# Patient Record
Sex: Female | Born: 1965 | Race: White | Hispanic: Yes | Marital: Married | Smoking: Never smoker
Health system: Southern US, Community
[De-identification: ages and names within clinical notes are randomized; demographics above are authoritative.]

## PROBLEM LIST (undated history)

## (undated) HISTORY — PX: CYST REMOVAL NECK: SHX6281

---

## 1999-04-18 ENCOUNTER — Emergency Department (HOSPITAL_COMMUNITY): Admission: EM | Admit: 1999-04-18 | Discharge: 1999-04-18 | Payer: Self-pay | Admitting: Emergency Medicine

## 1999-06-14 ENCOUNTER — Emergency Department (HOSPITAL_COMMUNITY): Admission: EM | Admit: 1999-06-14 | Discharge: 1999-06-14 | Payer: Self-pay | Admitting: Internal Medicine

## 1999-06-29 ENCOUNTER — Emergency Department (HOSPITAL_COMMUNITY): Admission: EM | Admit: 1999-06-29 | Discharge: 1999-06-29 | Payer: Self-pay | Admitting: Emergency Medicine

## 2004-01-14 ENCOUNTER — Inpatient Hospital Stay (HOSPITAL_COMMUNITY): Admission: EM | Admit: 2004-01-14 | Discharge: 2004-01-16 | Payer: Self-pay | Admitting: Emergency Medicine

## 2004-01-14 ENCOUNTER — Encounter: Payer: Self-pay | Admitting: Internal Medicine

## 2004-01-18 ENCOUNTER — Encounter: Payer: Self-pay | Admitting: Emergency Medicine

## 2004-01-18 ENCOUNTER — Emergency Department (HOSPITAL_COMMUNITY): Admission: EM | Admit: 2004-01-18 | Discharge: 2004-01-18 | Payer: Self-pay | Admitting: Emergency Medicine

## 2004-01-18 ENCOUNTER — Encounter (INDEPENDENT_AMBULATORY_CARE_PROVIDER_SITE_OTHER): Payer: Self-pay | Admitting: Cardiology

## 2004-01-25 ENCOUNTER — Encounter: Admission: RE | Admit: 2004-01-25 | Discharge: 2004-01-25 | Payer: Self-pay | Admitting: Internal Medicine

## 2004-02-24 ENCOUNTER — Encounter: Admission: RE | Admit: 2004-02-24 | Discharge: 2004-02-24 | Payer: Self-pay | Admitting: Internal Medicine

## 2005-09-28 ENCOUNTER — Emergency Department (HOSPITAL_COMMUNITY): Admission: EM | Admit: 2005-09-28 | Discharge: 2005-09-28 | Payer: Self-pay | Admitting: Emergency Medicine

## 2006-01-31 ENCOUNTER — Ambulatory Visit: Payer: Self-pay | Admitting: Gynecology

## 2006-02-08 ENCOUNTER — Encounter: Payer: Self-pay | Admitting: *Deleted

## 2006-02-08 ENCOUNTER — Ambulatory Visit (HOSPITAL_COMMUNITY): Admission: RE | Admit: 2006-02-08 | Discharge: 2006-02-08 | Payer: Self-pay | Admitting: *Deleted

## 2006-07-15 ENCOUNTER — Inpatient Hospital Stay (HOSPITAL_COMMUNITY): Admission: AD | Admit: 2006-07-15 | Discharge: 2006-07-15 | Payer: Self-pay | Admitting: Gynecology

## 2006-07-18 ENCOUNTER — Other Ambulatory Visit: Admission: RE | Admit: 2006-07-18 | Discharge: 2006-07-18 | Payer: Self-pay | Admitting: Obstetrics and Gynecology

## 2006-07-18 ENCOUNTER — Ambulatory Visit: Payer: Self-pay | Admitting: Obstetrics & Gynecology

## 2006-07-18 ENCOUNTER — Encounter (INDEPENDENT_AMBULATORY_CARE_PROVIDER_SITE_OTHER): Payer: Self-pay | Admitting: *Deleted

## 2006-07-18 ENCOUNTER — Encounter (INDEPENDENT_AMBULATORY_CARE_PROVIDER_SITE_OTHER): Payer: Self-pay | Admitting: Specialist

## 2006-08-01 ENCOUNTER — Ambulatory Visit: Payer: Self-pay | Admitting: Obstetrics and Gynecology

## 2006-10-02 ENCOUNTER — Ambulatory Visit: Payer: Self-pay | Admitting: Obstetrics and Gynecology

## 2006-12-23 ENCOUNTER — Ambulatory Visit: Payer: Self-pay | Admitting: Obstetrics and Gynecology

## 2006-12-23 ENCOUNTER — Ambulatory Visit (HOSPITAL_COMMUNITY): Admission: RE | Admit: 2006-12-23 | Discharge: 2006-12-23 | Payer: Self-pay | Admitting: Obstetrics and Gynecology

## 2006-12-23 ENCOUNTER — Encounter (INDEPENDENT_AMBULATORY_CARE_PROVIDER_SITE_OTHER): Payer: Self-pay | Admitting: *Deleted

## 2007-01-08 ENCOUNTER — Encounter (INDEPENDENT_AMBULATORY_CARE_PROVIDER_SITE_OTHER): Payer: Self-pay | Admitting: *Deleted

## 2007-01-08 ENCOUNTER — Other Ambulatory Visit: Admission: RE | Admit: 2007-01-08 | Discharge: 2007-01-08 | Payer: Self-pay | Admitting: Obstetrics and Gynecology

## 2007-01-08 ENCOUNTER — Ambulatory Visit: Payer: Self-pay | Admitting: *Deleted

## 2007-01-23 ENCOUNTER — Ambulatory Visit: Payer: Self-pay | Admitting: *Deleted

## 2007-07-11 ENCOUNTER — Inpatient Hospital Stay (HOSPITAL_COMMUNITY): Admission: AD | Admit: 2007-07-11 | Discharge: 2007-07-11 | Payer: Self-pay | Admitting: Gynecology

## 2007-10-30 ENCOUNTER — Inpatient Hospital Stay (HOSPITAL_COMMUNITY): Admission: AD | Admit: 2007-10-30 | Discharge: 2007-10-31 | Payer: Self-pay | Admitting: Obstetrics

## 2007-11-07 ENCOUNTER — Inpatient Hospital Stay (HOSPITAL_COMMUNITY): Admission: AD | Admit: 2007-11-07 | Discharge: 2007-11-07 | Payer: Self-pay | Admitting: Gynecology

## 2007-11-20 ENCOUNTER — Inpatient Hospital Stay (HOSPITAL_COMMUNITY): Admission: AD | Admit: 2007-11-20 | Discharge: 2007-11-20 | Payer: Self-pay | Admitting: Obstetrics

## 2008-02-23 ENCOUNTER — Inpatient Hospital Stay (HOSPITAL_COMMUNITY): Admission: AD | Admit: 2008-02-23 | Discharge: 2008-02-25 | Payer: Self-pay | Admitting: Obstetrics

## 2008-02-23 ENCOUNTER — Encounter (INDEPENDENT_AMBULATORY_CARE_PROVIDER_SITE_OTHER): Payer: Self-pay | Admitting: Obstetrics

## 2008-04-02 ENCOUNTER — Ambulatory Visit (HOSPITAL_COMMUNITY): Admission: RE | Admit: 2008-04-02 | Discharge: 2008-04-02 | Payer: Self-pay | Admitting: Obstetrics

## 2008-09-20 IMAGING — US US PELVIS COMPLETE MODIFY
1 series · 13 of 25 positions shown · non-contrast
Comparison: 11/20/2007

CLINICAL DATA: Known left-sided myoma.  6 weeks postpartum with
left adnexal pain.

TRANSABDOMINAL AND TRANSVAGINAL ULTRASOUND OF PELVIS
TECHNIQUE: Both transabdominal and transvaginal ultrasound
examinations of the pelvis were performed including evaluation of
the uterus, ovaries, adnexal regions, and pelvic cul-de-sac.

[Series 1: us pelvis complete modify · 0.28mm/px · 13 of 51 slices shown]
[im 1/51]
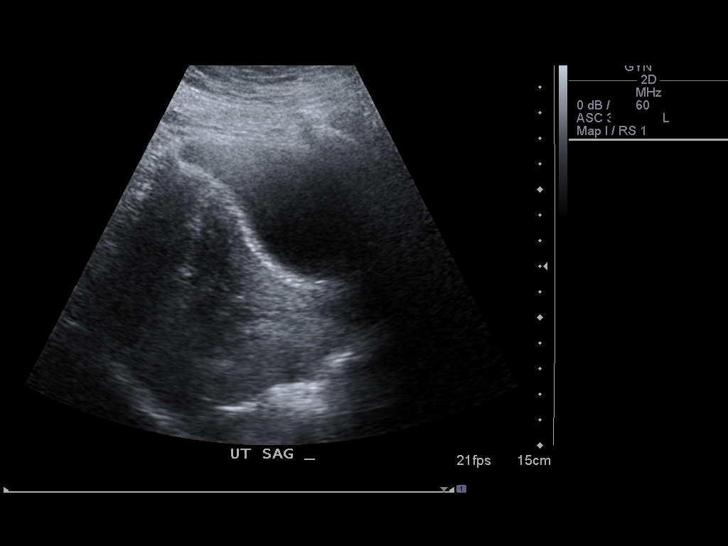
[im 5/51]
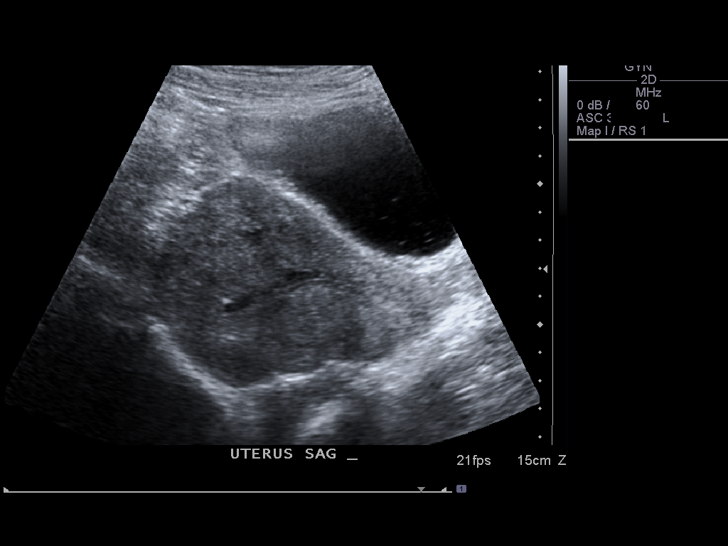
[im 9/51]
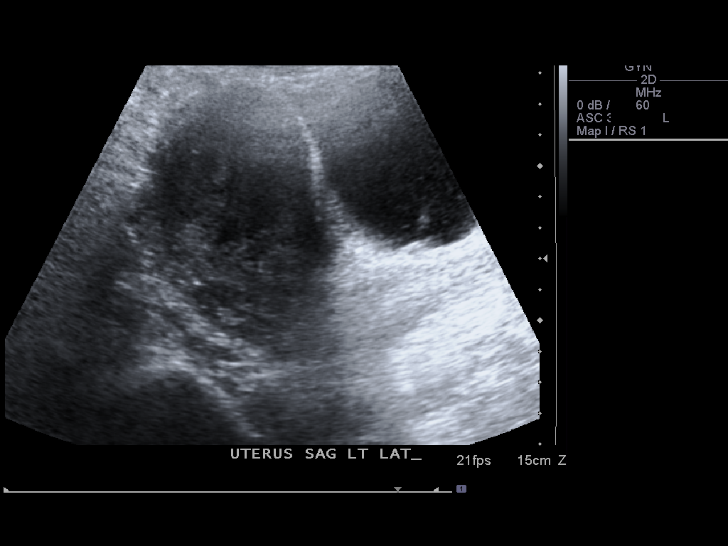
[im 13/51]
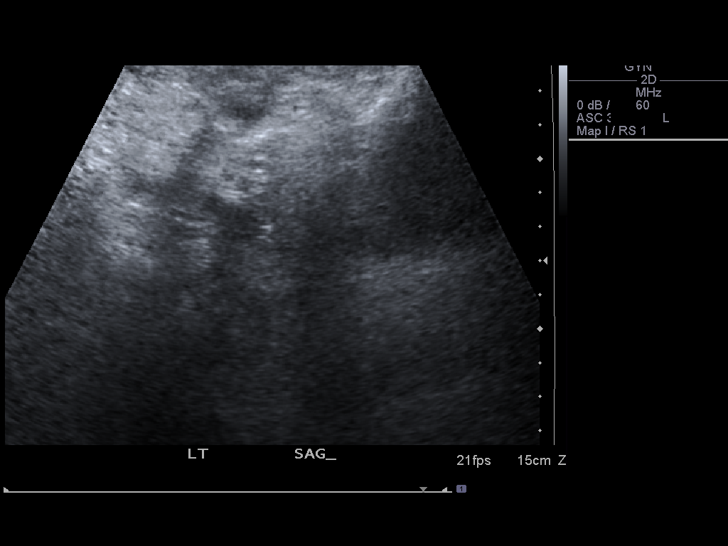
[im 17/51]
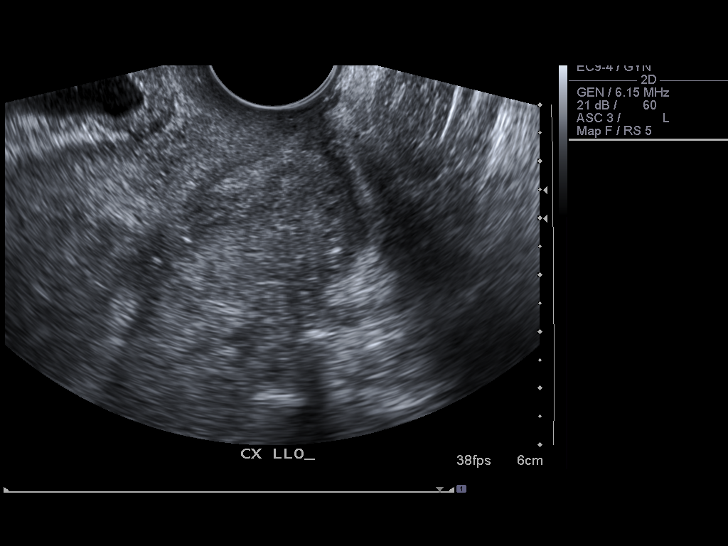
[im 21/51]
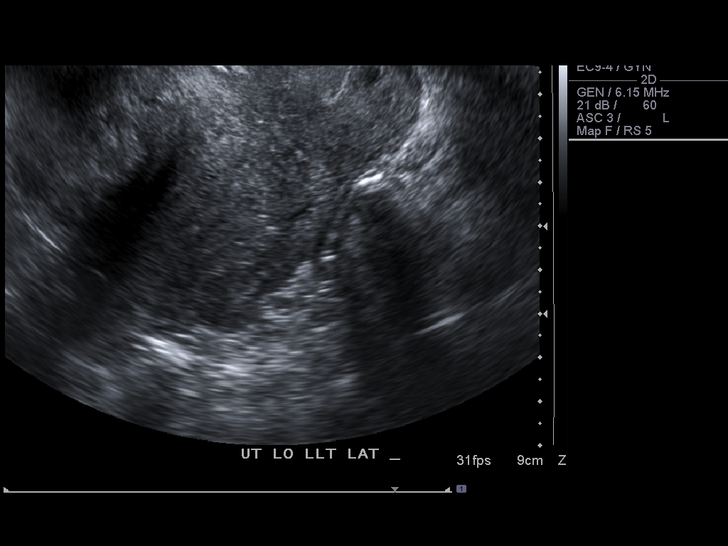
[im 26/51]
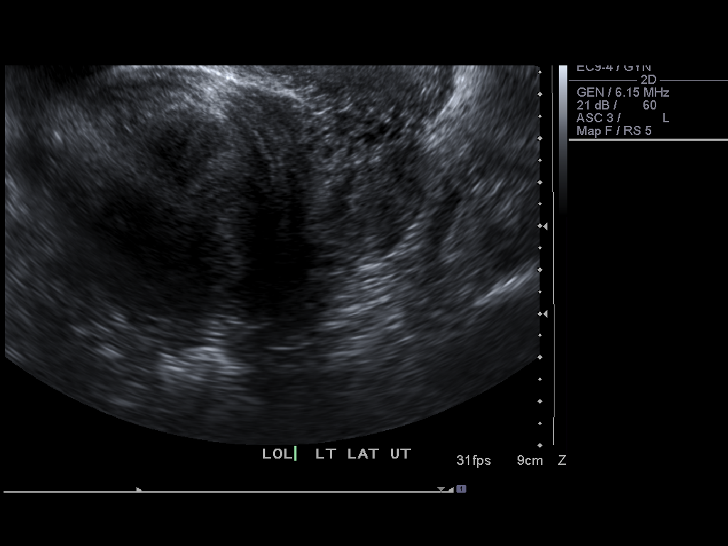
[im 30/51]
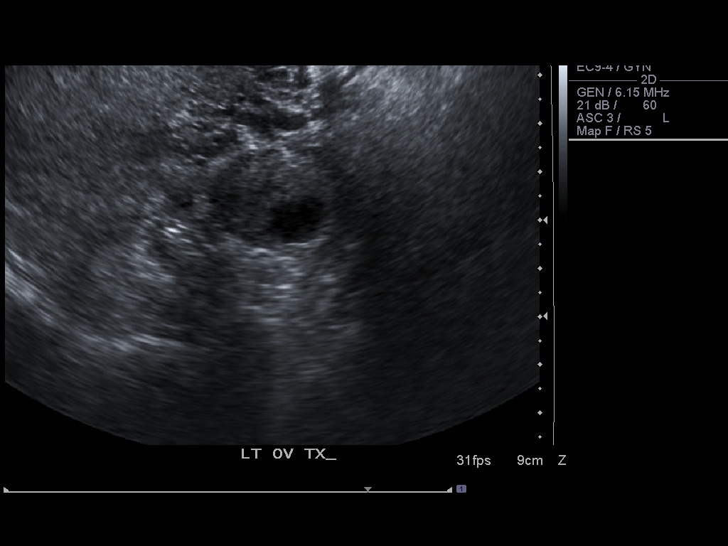
[im 34/51]
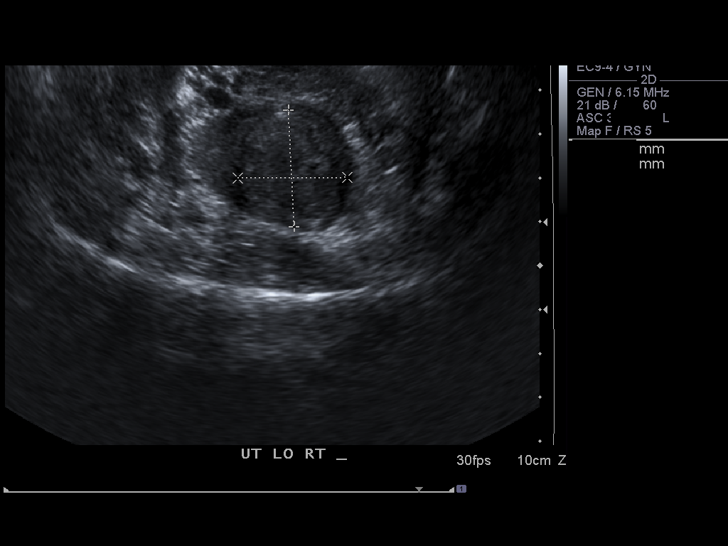
[im 38/51]
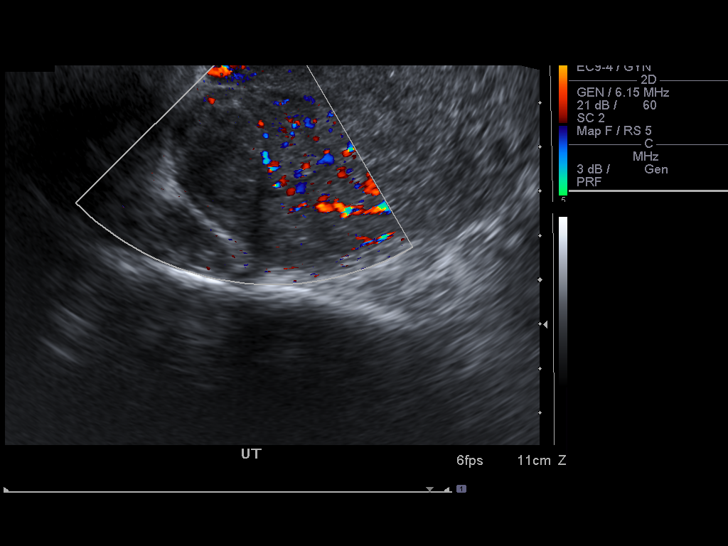
[im 42/51]
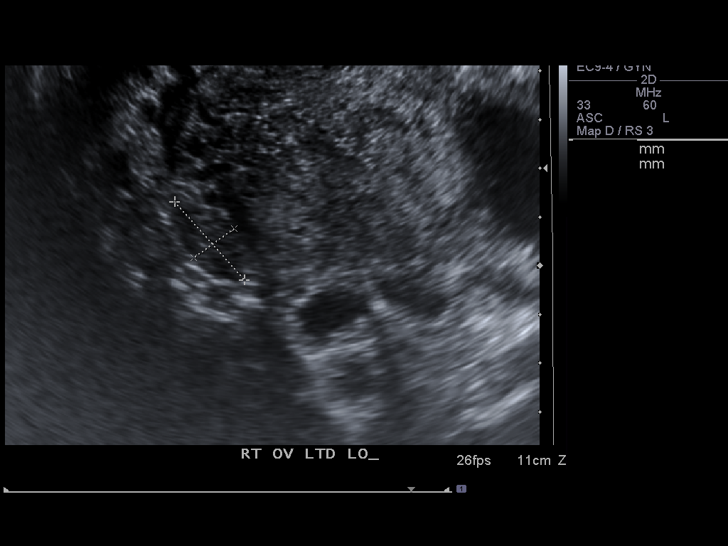
[im 46/51]
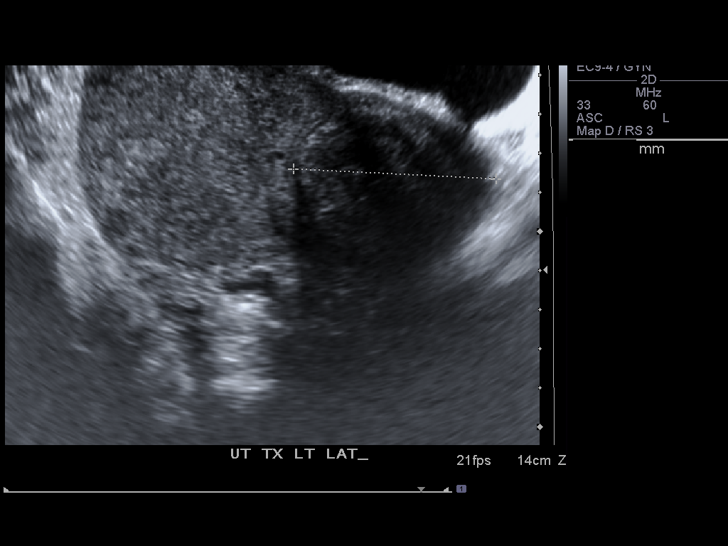
[im 51/51]
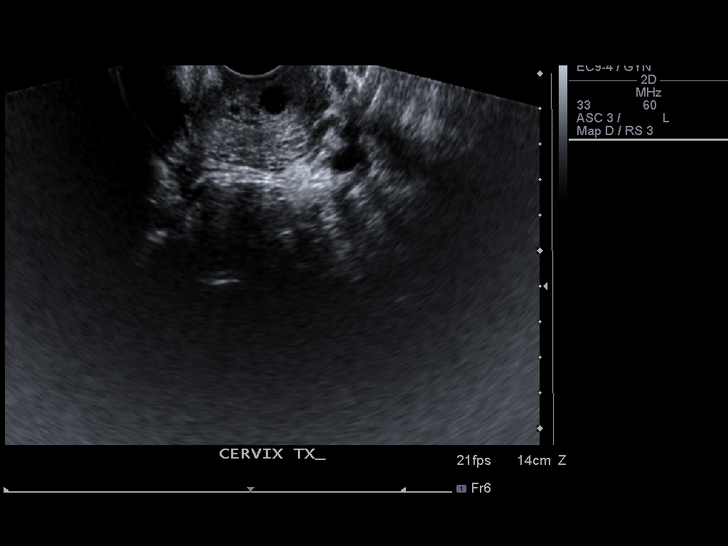

[13 of 25 positions shown; findings below may reference images not displayed]

FINDINGS: The uterus has a sagittal length of 9.8 cm, an AP width
of 7.1 cm and a transverse width of 7.7 cm.  Two focal fibroids are
identified with the largest located in the left lateral mid body
measuring 5.3 x 5.1 centimeters and in the right lateral upper
uterine segment measuring 1.8 x 2.7 x 2.5 cm.  Both of these have a
subserosal component

The endometrial lining is homogeneously echogenic with an AP width
of 1.1 cm.  This would correlate with the patient's recent
postpartum status.  No areas of focal thickening or inhomogeneity
are seen.

Both ovaries are visualized with the right ovary measuring 2.2 x
1.0  by 1.3 cm and the left ovary measuring 2.7 by 2.8 x 1.2 of 7
cm.. No pelvic fluid is seen
IMPRESSION: Uterine fibroids with sizes and locations as noted above.  Normal
postpartum endometrial lining.  Normal ovaries.

## 2010-02-05 ENCOUNTER — Emergency Department (HOSPITAL_COMMUNITY): Admission: EM | Admit: 2010-02-05 | Discharge: 2010-02-05 | Payer: Self-pay | Admitting: Emergency Medicine

## 2011-02-23 NOTE — Group Therapy Note (Signed)
[name], [name] NO.:  [id number]   MEDICAL RECORD NO.:  [id number]          PATIENT TYPE:  WOC   LOCATION:  [facility]                   FACILITY:  [facility]   PHYSICIAN:  Dorthula Perfect, MD     DATE OF BIRTH:  04-16-1966   DATE OF SERVICE:                                    CLINIC NOTE   This 45 year old Hispanic female gravida 0 is referred from [facility] for abnormal  vaginal bleeding.  She was seen there on October 8th.  The patient is not a  good historian.  The translator does all she can to help.  She started  bleeding September 18th and has pretty much been bleeding since that time.  When she was seen in the [facility], her hematocrit was 36.7, and her hemoglobin  was 12.4.  White count was normal.  The examiner thought that the uterus was  somewhat enlarged.  She has been in Lo-Ovral for a number of months in an  attempt to control the bleeding.  This had not been very successful.  In  reality, she has been having abnormal bleeding for probably the last year or  two.   In 2005, she had an ultrasound that revealed the uterus to be 8.4 x 5.2 x  6.7 cm.  There was a pedunculated myoma measuring about 3.5 cm in the left  side of the uterus.   When I started seeing the patient, I only had a skinny chart, and it turned  out she had been seen here in the past.  We then obtained a chart.  She was  seen here by Dr. Mia Marsh on January 31, 2006 because of heavy menses and  anemia.  At that time, he did not think an endometrial biopsy needed to be  done, but we did state that she should stop the birth control pills before  it is done; however, because of this continued history, I will do an  endometrial biopsy at this time, despite the fact that she is on hormonal  therapy.  An ultrasound was done Feb 08, 2006, which revealed a large  subserosal fibroid measuring 5 x 4.4 x 3.9 cm, and this was described as  having increased in size on the previous study when it was 3.7 cm in  greatest diameter.  The endometrium in May was 12-13 mm in maximum  thickness.  There was also suggestion of a small mucosal fibroid to be  present.   She is not sexually active at this time.  She is desirous of having a child  but understands at the age of 89 with uterine fibroids, this is probably not  going to happen.  Her last Pap smear was some time ago.  She believes her  Pap smears in the past have all been normal.   PHYSICAL EXAMINATION:  VITAL SIGNS:  Height 5 feet 1.  Weight 165.4.  Blood  pressure 128/78.  ABDOMEN:  Her abdomen is somewhat obese.  No masses are palpable.  There is  no abdominal tenderness.  PELVIC:  External genitalia and BS glands were normal.  Vaginal was  epithelialized as was the cervix.  A Pap smear  was obtained.  The uterus is  upper limits of normal size and somewhat nodular.  Adnexal structures,  including ovaries, were normal.   IMPRESSION:  Abnormal uterine bleeding.   DISPOSITION:  1. Pap smear.  2. An endometrial biopsy was done without difficulty.  The uterus sounded      approximately 11 cm.  3. She will return in a couple of weeks for results.  4. Possibilities in the future include changing her hormone preparation to      see if that helps her bleeding, perhaps a D&C and hysteroscopy to see      if this submucosal fibroid might be the problem with the bleeding, and      of course, the possibility of the definitive treatment with      hysterectomy.  5. Addendum:  I did not list above, but I learned late in the encounter      that her father has died this year from colon cancer.  At her next      visit, she needs to be counseled about starting colonoscopies.           ______________________________  Dorthula Perfect, MD     ER/MEDQ  D:  07/18/2006  T:  07/20/2006  Job:  [id number]

## 2011-02-23 NOTE — Group Therapy Note (Signed)
[name], [name] NO.:  [id number]   MEDICAL RECORD NO.:  [id number]          PATIENT TYPE:  WOC   LOCATION:  [facility]                   FACILITY:  [facility]   PHYSICIAN:  Argentina Donovan, MD        DATE OF BIRTH:  01-10-1966   DATE OF SERVICE:  08/01/2006                                    CLINIC NOTE   HISTORY:  The patient is a 45 year old Spanish speaking Hispanic female,  nulligravida who complains of persistent vaginal bleeding.  See previous  note.  Very difficult patient to deal with, even with the interpreter.  We  have given her choice of continuing to bleed since her perception is much  greater than  her actual amount of bleeding since she has a hemoglobin of  12.2 and a hematocrit of 32, endometrial ablation or hormone therapy.  She  stopped the hormone therapy because she does not want to take the pill and  she said it did not help.  This time she is not bleeding.  These I told her  were her only options with [place].  She is going to make up her mind what she  wants to do.   IMPRESSION:  Dysfunctional uterine bleeding.           ______________________________  Argentina Donovan, MD     PR/MEDQ  D:  08/01/2006  T:  08/02/2006  Job:  [id number]

## 2011-02-23 NOTE — Op Note (Signed)
[name], [name]      ACCOUNT NO.:  [id number]   MEDICAL RECORD NO.:  [id number]          PATIENT TYPE:  AMB   LOCATION:  [facility]                           [facility]   PHYSICIAN:  Phil D. Okey Dupre, M.D.     DATE OF BIRTH:  1966-01-30   DATE OF PROCEDURE:  12/23/2006  DATE OF DISCHARGE:                               OPERATIVE REPORT   PROCEDURE:  Hysteroscopic hydrothermal ablation hysteroscopy and  hydrothermal ablation of the endometrium plus postablation curettage.   SURGEON:  Dr. Okey Dupre   ANESTHESIA:  General plus local.   BLOOD LOSS:  Minimal.   POSTOPERATIVE CONDITION:  Satisfactory.   SPECIMENS TO PATHOLOGY:  Postablation curettings.   OPERATIVE FINDINGS:  On entering the uterus with the hysteroscope, we  saw multiple endometrial polyps with an endometrium that did not look  terribly plush.  It was decided that after the ablation, we would  curette the endometrium to make sure that the polyps were well removed.  The procedure went as follows:  Under satisfactory general anesthesia,  the patient in dorsal lithotomy position, perineum, vagina prepped and  draped in the usual sterile manner.  Bimanual pelvic examination under  anesthesia revealed the uterus at first-degree retroversion, freely  movable, normal size, shape, and consistency.  The adnexa were not well  palpated.  Cul-de-sac was free.  A weighted speculum was placed in the  posterior fourchette of the vagina through a marital introitus.  BUS  within normal limits.  Vagina is clean and well-rugated.  Anterior lip  of the cervix was grasped with a single-tooth tenaculum, and 10 mL of 1%  Xylocaine plain was injected into each of the paracervical areas at 4  and 8 o'clock for further patient comfort.  After this uterine cavity  was sounded to 7 cm, the cervical os dilated to #8 Hagar dilator, the  hysteroscope inserted into the uterine cavity.  The entire uterine  cavity was easily visualized with the  exception of the ostium on the  patient's right side which was blocked by a polyp, and I could not  manipulate the scope to see that.  We brought the scope out to just at  the internal os, reinserted it a small amount, and then started the  thermal ablation procedure, the usual heating up to 81 degrees.  Following that, the ablation started.  The temperature reached about 92  degrees cGy, and the ablation was undertaken for a 10-minute period at  which time the cooling off period occurred.  Blanching occurred well to  the entire uterine cavity that could be seen, and the scope was then  finally removed.  The uterine cavity was vigorously scraped with a  serrated curette and this tissue sent for pathological diagnosis.  However, I do not think that we will see very much except the ablated  remains.  The tenaculum was removed from the cervix.  The area was  observed for bleeding, and no significant bleeding was noted, and the  patient was transferred to recovery in satisfactory condition having  tolerated the procedure well.          ______________________________  Phil D. Okey Dupre, M.D.    PDR/MEDQ  D:  12/23/2006  T:  12/23/2006  Job:  [id number]

## 2011-02-23 NOTE — Group Therapy Note (Signed)
[name], [name] NO.:  [id number]   MEDICAL RECORD NO.:  [id number]          PATIENT TYPE:  WOC   LOCATION:  [facility]                   FACILITY:  [facility]   PHYSICIAN:  Carolanne Grumbling, M.D.   DATE OF BIRTH:  1966-02-26   DATE OF SERVICE:  01/08/2007                                  CLINIC NOTE   HISTORY OF PRESENT ILLNESS:  A 45 year old G0 here for postop visit.  Patient had hysteroscopic hydrothermal ablation at the endometrium plus  post ablative curettage for a history of dysfunctional uterine bleeding.  Pathology showed proliferative endometrium fragments suggestive of  endometrial polyps.  Since surgery, patient endorses a watery discharge.  Denies fever, chills, night sweats.   Upon review of the chart, it was noted that patient had an atypical Pap  smear on July 18, 2006 that showed glandular cell abnormality.  HPV  typing negative for high risk type.  No colposcopy done per the chart.   PHYSICAL EXAMINATION:  Blood pressure 123/70, weight 163.5 pounds.  GENERAL:  Well-developed, well-nourished female in no apparent distress.  GU:  Normal external genitalia, vagina pink rugae, cervix nulliparous.  There is an ulceration noted at approximately 4 o'clock.  Colposcopy  documented on the dysplasia sheet.  Dr. Penne Lash also evaluated cervix.   ASSESSMENT:  1. Endometrial biopsy with dysfunctional uterine bleeding, status post      hydro ablation and curettage.  2. Atypical glandular cells in October 2007, status post colposcopy      with endometrial biopsies and ECC today.   PLAN:  Repeat Pap and colpo done today.  Will have patient return in 2  weeks for followup.           ______________________________  Carolanne Grumbling, M.D.     TW/MEDQ  D:  01/08/2007  T:  01/08/2007  Job:  [id number]

## 2011-02-23 NOTE — Group Therapy Note (Signed)
[name], [name] NO.:  [id number]   MEDICAL RECORD NO.:  [id number]          PATIENT TYPE:  WOC   LOCATION:  [facility]                   [facility]ACILITY[facility]   PHYSICIAN:  Argentina Donovan, MD        DATE OF BIRTH:  03-03-1966   DATE OF SERVICE:                                  CLINIC NOTE   The patient is a 45 year old Hispanic, Spanish-speaking nulligravida  with persistent vaginal bleeding.  Apparently graded not as much as her  appreciation of it.  She has a normal hemoglobin.  However, we did an  endometrial biopsy previously, which showed obvious dysfunctional  bleeding with extensive breakdown and degenerative changes.  No  hyperplasia identified.  She did not want to continue oral  contraceptives or any other hormone therapy.  Was not sure what she  wanted to do.  We have talked to her about the options.  We have told  her about endometrial ablation.  She finally, after reading the  __________ reading, we were having a person translate for her and read  it, decided that she was going to go through the ablation procedure.  We  are going to scheduled for __________ hydro-ablation.  The written  Hispanic copy of the explanation has been given to the patient and is  described in detail the problem with the patient, and we will go ahead  and get it scheduled.           ______________________________  Argentina Donovan, MD     PR/MEDQ  D:  10/02/2006  T:  10/02/2006  Job:  [id number]

## 2011-02-23 NOTE — Group Therapy Note (Signed)
NAMESHRESHTA, MEDLEY NO.:  [id number]   MEDICAL RECORD NO.:  [id number]          PATIENT TYPE:  WOC   LOCATION:  [facility]                   [facility]ACILITY:  [facility]   PHYSICIAN:  Ginger Carne, MD DATE OF BIRTH:  06/25/1966   DATE OF SERVICE:  01/31/2006                                    CLINIC NOTE   REASON FOR CONSULTATION:  Heavy menses and anemia.   HISTORY OF PRESENT ILLNESS:  This is a 45 year old nulligravida Hispanic  female who is referred because of a history of heavy menses and a history of  anemia.  The patient was [facility] and demonstrated  hemoglobin of 8 and hematocrit of 24.7 on September 24, 2005.  At that time  she had reported heavy menses slightly irregular, but basically once a  month.  There is no history of suggestive of bleeding diatheses in her  family or congenitally.  This patient takes no medications to enhance her  bleeding propensity and takes no herbal remedy to contribute to the  bleeding.  She had been placed on multivitamins with iron; however, she has  stopped taking the iron recently.  She has been on oral contraceptives since  October 02, 2005.   The patient was in [facility] for microcytic anemia in April of  2005.  At that time an ultrasound was essentially unremarkable with a small  pedunculated fibroid.   IMPRESSION AND PLAN:  Abnormal uterine bleeding with anemia.  I explained to  the patient the best course of action at this point is to repeat a sonogram  to assure that no changes have occurred during the two year interval.  We  also repeated a CBC to assess her current anemic status.  Based on these two  findings patient will be appropriately managed in terms of iron replacement  therapy and possible hemoglobin electrophoresis if warranted.  I am not sure  at this time that an endometrial biopsy is necessary but I asked the patient  to stop taking birth control pills for about a  month and then if necessary a  biopsy will be performed without the effects of oral contraceptive agents.           ______________________________  Ginger Carne, MD     SHB/MEDQ  D:  01/31/2006  T:  02/01/2006  Job:  [id number]

## 2011-04-20 ENCOUNTER — Emergency Department (HOSPITAL_COMMUNITY)
Admission: EM | Admit: 2011-04-20 | Discharge: 2011-04-20 | Disposition: A | Discharge status: Home / Self Care | Payer: Self-pay | Attending: Emergency Medicine | Admitting: Emergency Medicine

## 2011-04-20 ENCOUNTER — Emergency Department (HOSPITAL_COMMUNITY): Payer: Self-pay

## 2011-04-20 DIAGNOSIS — L298 Other pruritus: Secondary | ICD-10-CM | POA: Insufficient documentation

## 2011-04-20 DIAGNOSIS — R1013 Epigastric pain: Secondary | ICD-10-CM | POA: Insufficient documentation

## 2011-04-20 DIAGNOSIS — M542 Cervicalgia: Secondary | ICD-10-CM | POA: Insufficient documentation

## 2011-04-20 DIAGNOSIS — M79609 Pain in unspecified limb: Secondary | ICD-10-CM | POA: Insufficient documentation

## 2011-04-20 DIAGNOSIS — L2989 Other pruritus: Secondary | ICD-10-CM | POA: Insufficient documentation

## 2011-04-20 DIAGNOSIS — R51 Headache: Secondary | ICD-10-CM | POA: Insufficient documentation

## 2011-04-20 DIAGNOSIS — M546 Pain in thoracic spine: Secondary | ICD-10-CM | POA: Insufficient documentation

## 2011-04-20 DIAGNOSIS — M545 Low back pain, unspecified: Secondary | ICD-10-CM | POA: Insufficient documentation

## 2011-04-20 DIAGNOSIS — R110 Nausea: Secondary | ICD-10-CM | POA: Insufficient documentation

## 2011-04-20 DIAGNOSIS — IMO0001 Reserved for inherently not codable concepts without codable children: Secondary | ICD-10-CM | POA: Insufficient documentation

## 2011-04-20 LAB — CBC
MCH: 31.8 pg (ref 26.0–34.0)
MCHC: 34.3 g/dL (ref 30.0–36.0)
MCV: 92.8 fL (ref 78.0–100.0)
Platelets: 217 10*3/uL (ref 150–400)
RBC: 3.87 MIL/uL (ref 3.87–5.11)
RDW: 13.1 % (ref 11.5–15.5)

## 2011-04-20 LAB — COMPREHENSIVE METABOLIC PANEL
ALT: 19 U/L (ref 0–35)
AST: 18 U/L (ref 0–37)
Albumin: 3.4 g/dL — ABNORMAL LOW (ref 3.5–5.2)
CO2: 26 mEq/L (ref 19–32)
Calcium: 8.8 mg/dL (ref 8.4–10.5)
Creatinine, Ser: <0.47 mg/dL — ABNORMAL LOW (ref 0.50–1.10)
Sodium: 137 mEq/L (ref 135–145)
Total Protein: 7.2 g/dL (ref 6.0–8.3)

## 2011-04-20 LAB — DIFFERENTIAL
Basophils Relative: 0 % (ref 0–1)
Eosinophils Absolute: 0.1 10*3/uL (ref 0.0–0.7)
Eosinophils Relative: 1 % (ref 0–5)
Lymphs Abs: 1.3 10*3/uL (ref 0.7–4.0)
Monocytes Relative: 13 % — ABNORMAL HIGH (ref 3–12)
Neutrophils Relative %: 65 % (ref 43–77)

## 2011-04-20 LAB — URINE MICROSCOPIC-ADD ON

## 2011-04-20 LAB — POCT PREGNANCY, URINE: Preg Test, Ur: NEGATIVE

## 2011-04-20 LAB — URINALYSIS, ROUTINE W REFLEX MICROSCOPIC
Glucose, UA: NEGATIVE mg/dL
Ketones, ur: NEGATIVE mg/dL
Leukocytes, UA: NEGATIVE
Nitrite: NEGATIVE
Specific Gravity, Urine: 1.021 (ref 1.005–1.030)
pH: 6.5 (ref 5.0–8.0)

## 2011-06-28 LAB — URINALYSIS, ROUTINE W REFLEX MICROSCOPIC
Bilirubin Urine: NEGATIVE
Bilirubin Urine: NEGATIVE
Glucose, UA: NEGATIVE
Hgb urine dipstick: NEGATIVE
Ketones, ur: NEGATIVE
Ketones, ur: NEGATIVE
Nitrite: NEGATIVE
Nitrite: NEGATIVE
Protein, ur: NEGATIVE
Specific Gravity, Urine: 1.01
Specific Gravity, Urine: 1.01
Urobilinogen, UA: 0.2
pH: 5.5
pH: 7
pH: 7.5

## 2011-06-28 LAB — URINE MICROSCOPIC-ADD ON

## 2011-06-29 LAB — DIFFERENTIAL
Eosinophils Absolute: 0
Lymphocytes Relative: 19
Lymphs Abs: 1.7
Neutro Abs: 6.5
Neutrophils Relative %: 72

## 2011-06-29 LAB — URINALYSIS, ROUTINE W REFLEX MICROSCOPIC
Nitrite: NEGATIVE
Protein, ur: NEGATIVE
Specific Gravity, Urine: 1.02
Urobilinogen, UA: 0.2

## 2011-06-29 LAB — CBC
MCV: 83.4
Platelets: 231
WBC: 8.9

## 2011-07-04 LAB — CBC
HCT: 30.5 — ABNORMAL LOW
Hemoglobin: 11.2 — ABNORMAL LOW
Hemoglobin: 9.9 — ABNORMAL LOW
MCHC: 32.6
MCV: 80.5
MCV: 81.2
Platelets: 178
RBC: 4.28
RDW: 21.9 — ABNORMAL HIGH
WBC: 11.4 — ABNORMAL HIGH

## 2011-07-04 LAB — COMPREHENSIVE METABOLIC PANEL
ALT: 10
AST: 23
Albumin: 2.7 — ABNORMAL LOW
Alkaline Phosphatase: 218 — ABNORMAL HIGH
BUN: 8
Chloride: 105
Potassium: 3.6
Sodium: 136
Total Bilirubin: 0.5

## 2011-07-04 LAB — URIC ACID: Uric Acid, Serum: 3.1

## 2011-07-04 LAB — RAPID HIV SCREEN (WH-MAU): Rapid HIV Screen: NONREACTIVE

## 2011-07-04 LAB — RPR: RPR Ser Ql: NONREACTIVE

## 2011-07-19 LAB — URINALYSIS, ROUTINE W REFLEX MICROSCOPIC
Bilirubin Urine: NEGATIVE
Ketones, ur: NEGATIVE
Nitrite: POSITIVE — AB
Protein, ur: NEGATIVE
Urobilinogen, UA: 0.2

## 2011-07-19 LAB — URINE CULTURE
Colony Count: NO GROWTH
Culture: NO GROWTH

## 2011-07-19 LAB — CBC
HCT: 31.1 — ABNORMAL LOW
Hemoglobin: 10.3 — ABNORMAL LOW
MCV: 75 — ABNORMAL LOW
Platelets: 308
RBC: 4.15

## 2011-07-19 LAB — POCT PREGNANCY, URINE
Operator id: 113551
Preg Test, Ur: POSITIVE

## 2011-07-19 LAB — WET PREP, GENITAL: Clue Cells Wet Prep HPF POC: NONE SEEN

## 2011-07-19 LAB — HCG, QUANTITATIVE, PREGNANCY: hCG, Beta Chain, Quant, S: 96187 — ABNORMAL HIGH

## 2013-02-19 ENCOUNTER — Encounter (HOSPITAL_COMMUNITY): Payer: Self-pay | Admitting: Emergency Medicine

## 2013-02-19 ENCOUNTER — Emergency Department (HOSPITAL_COMMUNITY)
Admission: EM | Admit: 2013-02-19 | Discharge: 2013-02-20 | Disposition: A | Discharge status: Home / Self Care | Payer: No Typology Code available for payment source | Attending: Emergency Medicine | Admitting: Emergency Medicine

## 2013-02-19 DIAGNOSIS — T1490XA Injury, unspecified, initial encounter: Secondary | ICD-10-CM | POA: Insufficient documentation

## 2013-02-19 DIAGNOSIS — IMO0002 Reserved for concepts with insufficient information to code with codable children: Secondary | ICD-10-CM | POA: Insufficient documentation

## 2013-02-19 DIAGNOSIS — Y92410 Unspecified street and highway as the place of occurrence of the external cause: Secondary | ICD-10-CM | POA: Insufficient documentation

## 2013-02-19 DIAGNOSIS — Y9389 Activity, other specified: Secondary | ICD-10-CM | POA: Insufficient documentation

## 2013-02-19 DIAGNOSIS — S199XXA Unspecified injury of neck, initial encounter: Secondary | ICD-10-CM | POA: Insufficient documentation

## 2013-02-19 DIAGNOSIS — M542 Cervicalgia: Secondary | ICD-10-CM

## 2013-02-19 DIAGNOSIS — M545 Low back pain: Secondary | ICD-10-CM

## 2013-02-19 DIAGNOSIS — S0993XA Unspecified injury of face, initial encounter: Secondary | ICD-10-CM | POA: Insufficient documentation

## 2013-02-19 DIAGNOSIS — T148XXA Other injury of unspecified body region, initial encounter: Secondary | ICD-10-CM
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
## 2013-02-20 ENCOUNTER — Emergency Department (HOSPITAL_COMMUNITY): Payer: No Typology Code available for payment source

## 2013-02-20 MED ORDER — HYDROCODONE-ACETAMINOPHEN 5-325 MG PO TABS
2.0000 | ORAL_TABLET | Freq: Once | ORAL | Status: AC
  Administered 2013-02-20: 2 via ORAL
  Filled 2013-02-20: qty 2

## 2013-02-20 MED ORDER — CYCLOBENZAPRINE HCL 10 MG PO TABS: 10.0000 mg | ORAL_TABLET | Freq: Two times a day (BID) | ORAL | Status: DC | PRN

## 2013-02-20 MED ORDER — NAPROXEN 500 MG PO TABS
500.0000 mg | ORAL_TABLET | Freq: Once | ORAL | Status: AC
  Administered 2013-02-20: 500 mg via ORAL
  Filled 2013-02-20: qty 1

## 2013-02-20 MED ORDER — DIAZEPAM 2 MG PO TABS
2.0000 mg | ORAL_TABLET | Freq: Once | ORAL | Status: AC
  Administered 2013-02-20: 2 mg via ORAL
  Filled 2013-02-20: qty 1

## 2013-02-20 MED ORDER — IBUPROFEN 600 MG PO TABS: 600.0000 mg | ORAL_TABLET | Freq: Four times a day (QID) | ORAL | Status: AC | PRN

## 2013-02-20 MED ORDER — HYDROCODONE-ACETAMINOPHEN 5-325 MG PO TABS: 1.0000 | ORAL_TABLET | Freq: Four times a day (QID) | ORAL | Status: DC | PRN
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
## 2022-09-19 ENCOUNTER — Ambulatory Visit: Payer: Self-pay | Admitting: Internal Medicine

## 2022-10-23 ENCOUNTER — Ambulatory Visit: Payer: Self-pay | Admitting: Internal Medicine

## 2022-10-24 ENCOUNTER — Ambulatory Visit: Payer: Self-pay | Admitting: Internal Medicine

## 2022-10-24 ENCOUNTER — Encounter: Payer: Self-pay | Admitting: Internal Medicine

## 2022-10-24 VITALS — BP 118/80 | HR 60 | Resp 14 | Ht 60.0 in | Wt 180.0 lb

## 2022-10-24 DIAGNOSIS — E669 Obesity, unspecified: Secondary | ICD-10-CM

## 2022-10-24 DIAGNOSIS — Z23 Encounter for immunization: Secondary | ICD-10-CM

## 2022-10-24 DIAGNOSIS — E119 Type 2 diabetes mellitus without complications: Secondary | ICD-10-CM

## 2022-10-24 DIAGNOSIS — K029 Dental caries, unspecified: Secondary | ICD-10-CM

## 2022-10-24 NOTE — Progress Notes (Signed)
Subjective:    Patient ID: [name], female   DOB: [birth year], 57 y.o.   MRN: [id number]   HPI  Here to establish Poor historian. Husband is Docia Barrier who just established.     Possible previous diagnosis of DM in past:  States another clinic about 1-2 years ago.  States she was told she needed to be on medication, but she never returned.   Denies polydipsia, but does seem to urinate a lot.    2.  Wonders about taking vitamins as she forgets things.  Gives example of being given a phone number she forgets 2 weeks later.  Not clear she is having memory issues.  Again, difficult to get a good history.     Current Meds  Medication Sig   ibuprofen (ADVIL,MOTRIN) 600 MG tablet Take 1 tablet (600 mg total) by mouth every 6 (six) hours as needed for pain.   No Known Allergies  No past medical history on file.  Past Surgical History:  Procedure Laterality Date   CYST REMOVAL NECK     Family History  Problem Relation Age of Onset   Diabetes Mother    Cancer Father        prostate:  cause of death--refused surgery   Diabetes Brother    Social History   Socioeconomic History   Marital status: Married    Spouse name: Docia Barrier   Number of children: 1   Years of education: 5   Highest education level: 5th grade  Occupational History   Occupation: Homemaker  Tobacco Use   Smoking status: Never    Passive exposure: Never   Smokeless tobacco: Never  Vaping Use   Vaping Use: Never used  Substance and Sexual Activity   Alcohol use: No   Drug use: No   Sexual activity: Not on file  Other Topics Concern   Not on file  Social History Narrative   Not on file   Social Determinants of Health   Financial Resource Strain: Medium Risk (10/24/2022)   Overall Financial Resource Strain (CARDIA)    Difficulty of Paying Living Expenses: Somewhat hard  Food Insecurity: Food Insecurity Present (10/24/2022)   Hunger Vital Sign    Worried About  Running Out of Food in the Last Year: Sometimes true    Ran Out of Food in the Last Year: Sometimes true  Transportation Needs: Not on file  Physical Activity: Not on file  Stress: Not on file  Social Connections: Not on file  Intimate Partner Violence: Not At Risk (10/24/2022)   Humiliation, Afraid, Rape, and Kick questionnaire    Fear of Current or Ex-Partner: No    Emotionally Abused: No    Physically Abused: No    Sexually Abused: No         Review of Systems    Objective:   BP 118/80 (BP Location: Right Arm, Patient Position: Sitting, Cuff Size: Normal)   Pulse 60   Resp 14   Ht 5' (1.524 m)   Wt 180 lb (81.6 kg)   LMP 01/25/2013   BMI 35.15 kg/m   Physical Exam NAD HEENT:  PERRL EOMI, Deep upper right premolar cavity with posterior tooth broken off.  Throat without injection Neck:  Supple, No adenopathy, no thyromegaly Chest:  CTA CV:  RRR with normal S1 and S2, No S3, S4 or murmur.  No carotid bruits.  Carotid, radial and DP pulses normal and equal Abd:  S, NT, No HSM or  mass, + BS LE:  No edema. Skin:  dry.   Assessment & Plan    Obesity with DM vs prediabetes hx:  FLP, A1C, CBC, CMP,  TSH.  Discussed will get back with her once see lab results as to whether needs medication.    2.  Dental decay and pain:  Dental referral.  Reportedly working on [employer].    3.  HM:  encouraged influenza vaccine and to obtain every year in fall.  Spikevax COVID  and Td Vaccines today    4.  Housing, food and financial difficulties:  Already working closely with Leeann Must.  [employer] and money owed on rent and rent for January covered.

## 2022-10-25 LAB — CBC WITH DIFFERENTIAL/PLATELET
Basophils Absolute: 0 10*3/uL (ref 0.0–0.2)
Basos: 1 %
EOS (ABSOLUTE): 0 10*3/uL (ref 0.0–0.4)
Eos: 1 %
Hematocrit: 45 % (ref 34.0–46.6)
Hemoglobin: 14.4 g/dL (ref 11.1–15.9)
Immature Grans (Abs): 0 10*3/uL (ref 0.0–0.1)
Immature Granulocytes: 0 %
Lymphocytes Absolute: 2.9 10*3/uL (ref 0.7–3.1)
Lymphs: 44 %
MCH: 31.2 pg (ref 26.6–33.0)
MCHC: 32 g/dL (ref 31.5–35.7)
MCV: 98 fL — ABNORMAL HIGH (ref 79–97)
Monocytes Absolute: 0.3 10*3/uL (ref 0.1–0.9)
Monocytes: 5 %
Neutrophils Absolute: 3.2 10*3/uL (ref 1.4–7.0)
Neutrophils: 49 %
Platelets: 291 10*3/uL (ref 150–450)
RBC: 4.61 x10E6/uL (ref 3.77–5.28)
RDW: 12.8 % (ref 11.7–15.4)
WBC: 6.5 10*3/uL (ref 3.4–10.8)

## 2022-10-25 LAB — LIPID PANEL W/O CHOL/HDL RATIO
Cholesterol, Total: 189 mg/dL (ref 100–199)
HDL: 42 mg/dL (ref >39)
LDL Chol Calc (NIH): 130 mg/dL — ABNORMAL HIGH (ref 0–99)
Triglycerides: 92 mg/dL (ref 0–149)
VLDL Cholesterol Cal: 17 mg/dL (ref 5–40)

## 2022-10-25 LAB — COMPREHENSIVE METABOLIC PANEL
ALT: 31 IU/L (ref 0–32)
AST: 26 IU/L (ref 0–40)
Albumin/Globulin Ratio: 1.3 (ref 1.2–2.2)
Albumin: 4.2 g/dL (ref 3.8–4.9)
Alkaline Phosphatase: 152 IU/L — ABNORMAL HIGH (ref 44–121)
BUN/Creatinine Ratio: 18 (ref 9–23)
BUN: 10 mg/dL (ref 6–24)
Bilirubin Total: 0.4 mg/dL (ref 0.0–1.2)
CO2: 26 mmol/L (ref 20–29)
Calcium: 9.6 mg/dL (ref 8.7–10.2)
Chloride: 100 mmol/L (ref 96–106)
Creatinine, Ser: 0.55 mg/dL — ABNORMAL LOW (ref 0.57–1.00)
Globulin, Total: 3.2 g/dL (ref 1.5–4.5)
Glucose: 257 mg/dL — ABNORMAL HIGH (ref 70–99)
Potassium: 4.7 mmol/L (ref 3.5–5.2)
Sodium: 140 mmol/L (ref 134–144)
Total Protein: 7.4 g/dL (ref 6.0–8.5)
eGFR: 108 mL/min/{1.73_m2} (ref >59)

## 2022-10-25 LAB — TSH: TSH: 1.58 u[IU]/mL (ref 0.450–4.500)

## 2022-10-25 LAB — HGB A1C W/O EAG: Hgb A1c MFr Bld: 11.8 % — ABNORMAL HIGH (ref 4.8–5.6)

## 2022-10-26 MED ORDER — GLIPIZIDE 5 MG PO TABS: ORAL_TABLET | ORAL | 11 refills | Status: DC

## 2022-10-26 MED ORDER — METFORMIN HCL ER 500 MG PO TB24: ORAL_TABLET | ORAL | 11 refills | Status: DC

## 2022-10-26 NOTE — Addendum Note (Signed)
Addended by: Marcelino Duster on: 10/26/2022 09:20 AM   Modules accepted: Orders

## 2022-11-05 NOTE — Progress Notes (Signed)
Pt.notified

## 2023-01-14 ENCOUNTER — Other Ambulatory Visit: Payer: Self-pay

## 2023-01-14 DIAGNOSIS — E669 Obesity, unspecified: Secondary | ICD-10-CM

## 2023-01-14 DIAGNOSIS — E119 Type 2 diabetes mellitus without complications: Secondary | ICD-10-CM

## 2023-01-15 LAB — LIPID PANEL W/O CHOL/HDL RATIO
Cholesterol, Total: 201 mg/dL — ABNORMAL HIGH (ref 100–199)
HDL: 45 mg/dL (ref >39)
LDL Chol Calc (NIH): 137 mg/dL — ABNORMAL HIGH (ref 0–99)
Triglycerides: 106 mg/dL (ref 0–149)
VLDL Cholesterol Cal: 19 mg/dL (ref 5–40)

## 2023-01-15 LAB — MICROALBUMIN / CREATININE URINE RATIO
Creatinine, Urine: 107.4 mg/dL
Microalb/Creat Ratio: 31 mg/g creat — ABNORMAL HIGH (ref 0–29)
Microalbumin, Urine: 33.8 ug/mL

## 2023-01-15 LAB — HEMOGLOBIN A1C
Est. average glucose Bld gHb Est-mCnc: 235 mg/dL
Hgb A1c MFr Bld: 9.8 % — ABNORMAL HIGH (ref 4.8–5.6)

## 2023-01-28 ENCOUNTER — Ambulatory Visit: Payer: Self-pay | Admitting: Internal Medicine

## 2023-06-10 ENCOUNTER — Ambulatory Visit: Payer: Self-pay | Admitting: Internal Medicine

## 2023-06-11 ENCOUNTER — Ambulatory Visit: Payer: Self-pay | Admitting: Internal Medicine

## 2023-06-17 ENCOUNTER — Telehealth: Payer: Self-pay

## 2023-06-17 NOTE — Telephone Encounter (Signed)
Patient would like to be on waiting list for a sooner appointment,  Patient is available on Mondays or Thursdays in the morning.  Will call patient if there is a cancellation.

## 2023-07-20 ENCOUNTER — Telehealth: Payer: Self-pay | Admitting: Internal Medicine

## 2023-07-25 NOTE — Telephone Encounter (Signed)
Lvm asking to return my call.

## 2023-07-26 NOTE — Telephone Encounter (Signed)
Labs have been scheduled. 

## 2023-10-14 ENCOUNTER — Other Ambulatory Visit: Payer: Self-pay

## 2023-10-14 ENCOUNTER — Telehealth: Payer: Self-pay

## 2023-10-14 NOTE — Telephone Encounter (Signed)
 Patient is available only on Mondays.

## 2023-10-14 NOTE — Telephone Encounter (Signed)
 Patient's OV appointment was cancel and patient would like to be on wait list for a cancellation.   We will call patient if there is a cancellation.

## 2023-10-21 ENCOUNTER — Ambulatory Visit: Payer: Self-pay | Admitting: Internal Medicine

## 2023-10-21 ENCOUNTER — Other Ambulatory Visit: Payer: Self-pay

## 2023-10-21 DIAGNOSIS — E119 Type 2 diabetes mellitus without complications: Secondary | ICD-10-CM

## 2023-10-21 DIAGNOSIS — E669 Obesity, unspecified: Secondary | ICD-10-CM

## 2023-10-21 MED ORDER — GLIPIZIDE 5 MG PO TABS: ORAL_TABLET | ORAL | 4 refills | Status: AC

## 2023-10-21 MED ORDER — METFORMIN HCL ER 500 MG PO TB24: ORAL_TABLET | ORAL | 4 refills | Status: AC

## 2023-10-22 LAB — MICROALBUMIN / CREATININE URINE RATIO
Creatinine, Urine: 61 mg/dL
Microalb/Creat Ratio: 98 mg/g{creat} — ABNORMAL HIGH (ref 0–29)
Microalbumin, Urine: 59.5 ug/mL

## 2023-10-22 LAB — LIPID PANEL W/O CHOL/HDL RATIO
Cholesterol, Total: 230 mg/dL — ABNORMAL HIGH (ref 100–199)
HDL: 44 mg/dL (ref >39)
LDL Chol Calc (NIH): 157 mg/dL — ABNORMAL HIGH (ref 0–99)
Triglycerides: 159 mg/dL — ABNORMAL HIGH (ref 0–149)
VLDL Cholesterol Cal: 29 mg/dL (ref 5–40)

## 2023-10-22 LAB — HEMOGLOBIN A1C
Est. average glucose Bld gHb Est-mCnc: 358 mg/dL
Hgb A1c MFr Bld: 14.1 % — ABNORMAL HIGH (ref 4.8–5.6)

## 2023-12-09 NOTE — Telephone Encounter (Signed)
 Called patient to offer appointment, patient did not take.

## 2024-02-10 ENCOUNTER — Ambulatory Visit: Payer: Self-pay | Admitting: Internal Medicine

## 2024-03-27 ENCOUNTER — Ambulatory Visit: Payer: Self-pay
